# Patient Record
Sex: Female | Born: 1975 | Race: White | Hispanic: No | Marital: Single | Smoking: Current every day smoker
Health system: Southern US, Community
[De-identification: ages and names within clinical notes are randomized; demographics above are authoritative.]

## PROBLEM LIST (undated history)

## (undated) DIAGNOSIS — M549 Dorsalgia, unspecified: Secondary | ICD-10-CM

## (undated) DIAGNOSIS — IMO0002 Reserved for concepts with insufficient information to code with codable children: Secondary | ICD-10-CM

## (undated) DIAGNOSIS — G8929 Other chronic pain: Secondary | ICD-10-CM

## (undated) HISTORY — PX: ABDOMINAL SURGERY: SHX537

## (undated) HISTORY — PX: HERNIA REPAIR: SHX51

## (undated) HISTORY — PX: ABDOMINAL HYSTERECTOMY: SHX81

## (undated) HISTORY — PX: OOPHORECTOMY: SHX86

---

## 2001-05-27 ENCOUNTER — Ambulatory Visit (HOSPITAL_COMMUNITY): Admission: RE | Admit: 2001-05-27 | Discharge: 2001-05-27 | Payer: Self-pay | Admitting: Obstetrics and Gynecology

## 2001-05-27 ENCOUNTER — Encounter: Payer: Self-pay | Admitting: Obstetrics and Gynecology

## 2001-06-06 ENCOUNTER — Encounter: Payer: Self-pay | Admitting: Obstetrics and Gynecology

## 2001-06-06 ENCOUNTER — Ambulatory Visit (HOSPITAL_COMMUNITY): Admission: RE | Admit: 2001-06-06 | Discharge: 2001-06-06 | Payer: Self-pay | Admitting: Obstetrics and Gynecology

## 2001-11-25 ENCOUNTER — Observation Stay (HOSPITAL_COMMUNITY): Admission: RE | Admit: 2001-11-25 | Discharge: 2001-11-26 | Payer: Self-pay | Admitting: General Surgery

## 2004-04-22 ENCOUNTER — Ambulatory Visit: Payer: Self-pay | Admitting: *Deleted

## 2004-06-07 ENCOUNTER — Ambulatory Visit: Payer: Self-pay | Admitting: Family Medicine

## 2004-09-13 ENCOUNTER — Ambulatory Visit: Payer: Self-pay | Admitting: Family Medicine

## 2004-10-26 ENCOUNTER — Ambulatory Visit: Payer: Self-pay | Admitting: Family Medicine

## 2005-01-02 ENCOUNTER — Ambulatory Visit: Payer: Self-pay | Admitting: Family Medicine

## 2005-01-13 ENCOUNTER — Ambulatory Visit: Payer: Self-pay | Admitting: Family Medicine

## 2005-01-31 ENCOUNTER — Encounter: Admission: RE | Admit: 2005-01-31 | Discharge: 2005-03-29 | Payer: Self-pay | Admitting: Family Medicine

## 2005-02-28 ENCOUNTER — Ambulatory Visit: Payer: Self-pay | Admitting: Family Medicine

## 2005-11-30 ENCOUNTER — Ambulatory Visit (HOSPITAL_COMMUNITY): Admission: RE | Admit: 2005-11-30 | Discharge: 2005-11-30 | Payer: Self-pay | Admitting: Family Medicine

## 2006-11-10 ENCOUNTER — Emergency Department (HOSPITAL_COMMUNITY): Admission: EM | Admit: 2006-11-10 | Discharge: 2006-11-10 | Payer: Self-pay | Admitting: Emergency Medicine

## 2008-12-28 IMAGING — CR DG FOOT COMPLETE 3+V*L*
3 series · 3 of 3 positions shown · non-contrast
Comparison: none

CLINICAL DATA: Left foot trauma and laceration. Pain. 
 LEFT FOOT - 3 VIEW:

[view not recorded (1 of 3)]
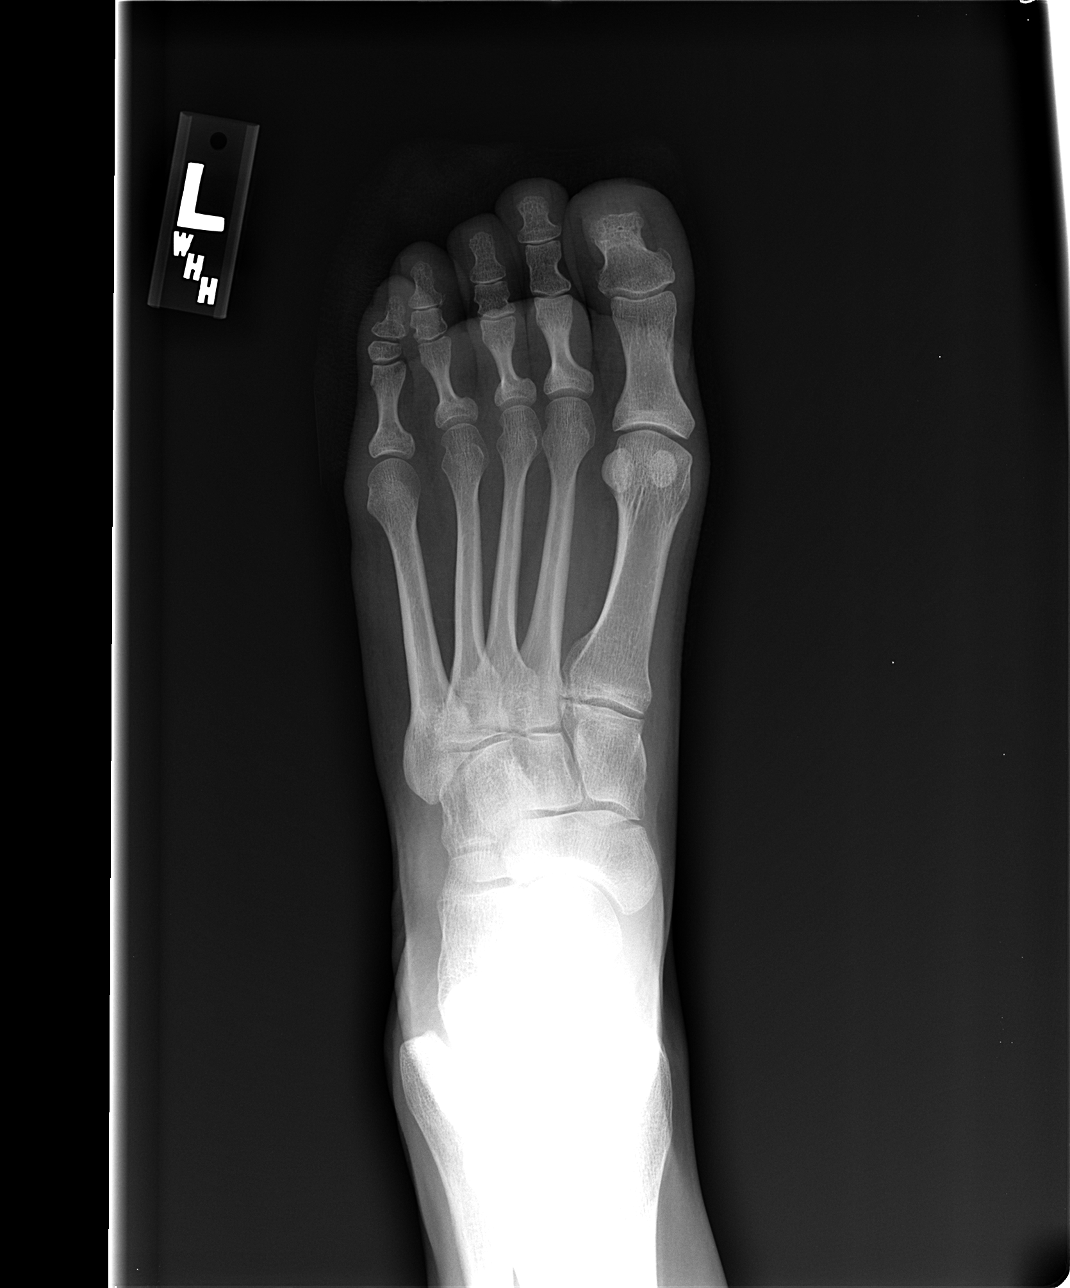

[view not recorded (2 of 3)]
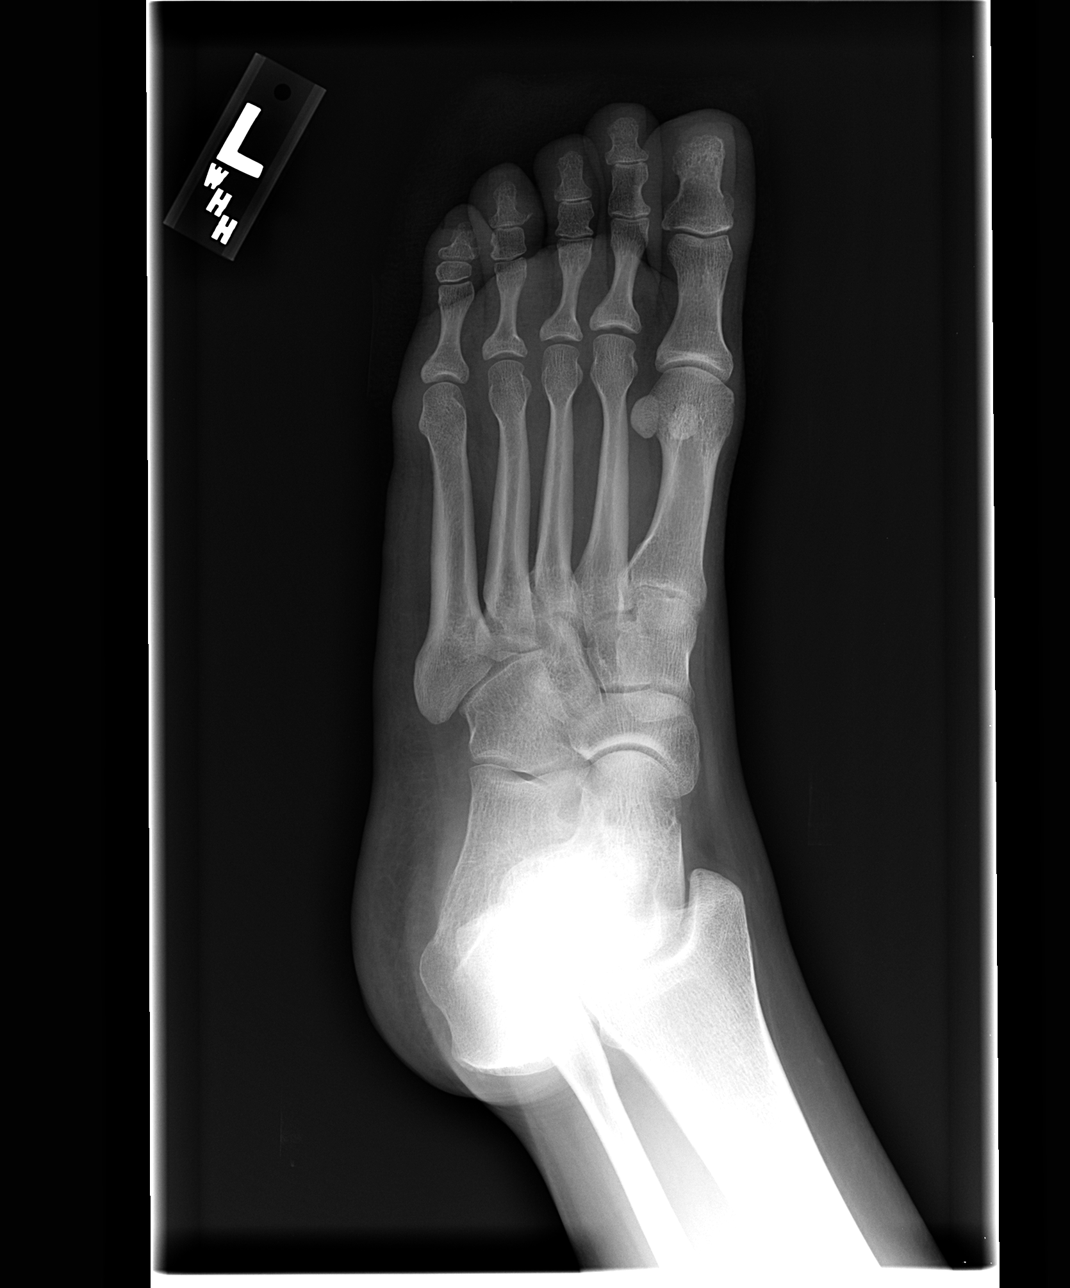

[view not recorded (3 of 3)]
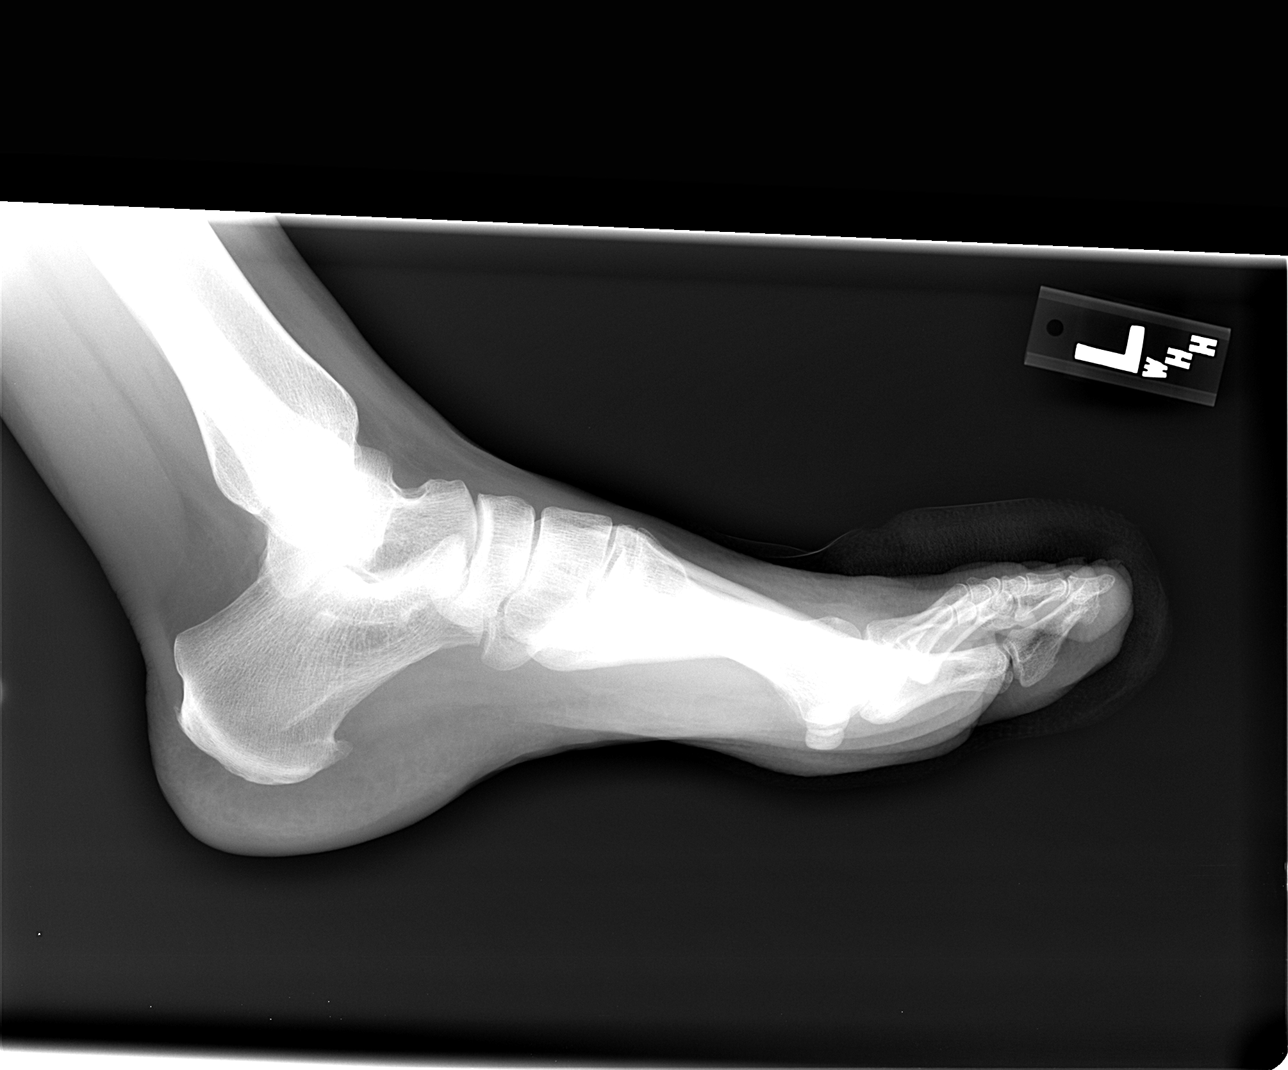

[3 of 3 positions shown; findings below may reference images not displayed]

FINDINGS: There is no evidence of fracture or dislocation. There is no evidence of bone destruction or periosteal reaction. There is a tiny linear radiodensity in the fourth toe along the medial aspect of the distal phalanx, suspicious for a small radiopaque foreign body. 
 Plantar calcaneal spur is also noted.
IMPRESSION: 1.  No evidence of fracture or dislocation.
 2.  Tiny radiodensity in the fourth toe along the medial aspect of the distal phalanx, suspicious for small radiopaque foreign body.

## 2009-08-02 ENCOUNTER — Emergency Department (HOSPITAL_COMMUNITY): Admission: EM | Admit: 2009-08-02 | Discharge: 2009-08-02 | Payer: Self-pay | Admitting: Emergency Medicine

## 2010-08-07 LAB — URINALYSIS, ROUTINE W REFLEX MICROSCOPIC
Hgb urine dipstick: NEGATIVE
Specific Gravity, Urine: >1.03 — ABNORMAL HIGH (ref 1.005–1.030)
Urobilinogen, UA: 0.2 mg/dL (ref 0.0–1.0)
pH: 6 (ref 5.0–8.0)

## 2010-08-07 LAB — PREGNANCY, URINE: Preg Test, Ur: NEGATIVE

## 2010-09-30 NOTE — Op Note (Signed)
[facility]  Patient:    Jasmine Becker, Jasmine Becker Visit Number: [id number] MRN: [id number]          Service Type: DSU Location: 3A A305 01 Attending Physician:  Dalia Heading Dictated by:   Franky Macho, M.D. Proc. Date: 11/25/01 Admit Date:  11/25/2001 Discharge Date: 11/26/2001   CC:         Dr. Despina Hidden   Operative Report  AGE:  35 years old  PREOPERATIVE DIAGNOSIS:  Incisional hernia.  POSTOPERATIVE DIAGNOSIS:  Incisional hernia.  PROCEDURE:  Incisional herniorrhaphy with mesh.  SURGEON:  Franky Macho, M.D.  ANESTHESIA:  General endotracheal.  INDICATIONS:  The patient is a 35 year old white female who presents with an incisional hernia in the suprapubic region.  She has had a previous lower midline incision, as well as Pfannenstiel incision.  The risks and the benefits of the procedure including bleeding, infection, and recurrence of the hernia were fully explained to the patient. I gained informed consent.  PROCEDURE NOTE:  The patient was placed in the supine position. After induction of general endotracheal anesthesia, the abdomen was prepped and draped using the usual sterile technique with Betadine.  A midline incision was made down to the suprapubic region.  The hernia defect was noted to be approximately 6-8 cm in size.  The hernia sac was noted to emanate through the rectus abdominis muscle.  This was freed away from the surrounding fascia and reduced.  The rectus muscle was then closed over the hernia defect.  Marlex mesh was then placed along this region, and the fascia of the anterior abdominal wall was then closed over this using 0 Surgitek interrupted sutures.  The superior portion of the abdominal wall was inspected, and several 0 Novofil sutures were placed to tighten the muscle where it appeared to be lax.  The wound was irrigated with normal saline. 0.5% Sensorcaine was instilled into the surrounding wound.  The  subcutaneous layer was reapproximated using a 2-0 Vicryl interrupted suture. The skin was closed using staples.  Betadine ointment and dry, sterile dressings were applied.  All tape and needle counts were correct at the end of the procedure.  The patient was extubated in the operating room and went back to the recovery room, awake and in stable condition.  COMPLICATIONS:  None.  SPECIMENS:  None.  BLOOD LOSS:  Minimal. Dictated by:   Franky Macho, M.D. Attending Physician:  Dalia Heading DD:  11/25/01 TD:  11/26/01 Job: [id number]

## 2011-01-10 ENCOUNTER — Emergency Department (HOSPITAL_COMMUNITY)
Admission: EM | Admit: 2011-01-10 | Discharge: 2011-01-10 | Disposition: A | Discharge status: Home / Self Care | Payer: Self-pay | Attending: Emergency Medicine | Admitting: Emergency Medicine

## 2011-01-10 DIAGNOSIS — R109 Unspecified abdominal pain: Secondary | ICD-10-CM | POA: Insufficient documentation

## 2011-01-10 DIAGNOSIS — A499 Bacterial infection, unspecified: Secondary | ICD-10-CM | POA: Insufficient documentation

## 2011-01-10 DIAGNOSIS — F17200 Nicotine dependence, unspecified, uncomplicated: Secondary | ICD-10-CM | POA: Insufficient documentation

## 2011-01-10 DIAGNOSIS — B9689 Other specified bacterial agents as the cause of diseases classified elsewhere: Secondary | ICD-10-CM | POA: Insufficient documentation

## 2011-01-10 DIAGNOSIS — N760 Acute vaginitis: Secondary | ICD-10-CM | POA: Insufficient documentation

## 2011-01-10 LAB — URINALYSIS, ROUTINE W REFLEX MICROSCOPIC
Glucose, UA: NEGATIVE mg/dL
Leukocytes, UA: NEGATIVE
Protein, ur: NEGATIVE mg/dL
Specific Gravity, Urine: 1.025 (ref 1.005–1.030)
Urobilinogen, UA: 0.2 mg/dL (ref 0.0–1.0)

## 2011-01-10 LAB — WET PREP, GENITAL: Yeast Wet Prep HPF POC: NONE SEEN

## 2011-01-10 MED ORDER — METRONIDAZOLE 500 MG PO TABS: ORAL_TABLET | ORAL | Status: DC

## 2011-01-10 MED ORDER — AZITHROMYCIN 250 MG PO TABS
1000.0000 mg | ORAL_TABLET | Freq: Once | ORAL | Status: AC
  Administered 2011-01-10: 1000 mg via ORAL
  Filled 2011-01-10: qty 4

## 2011-01-10 MED ORDER — HYDROCODONE-ACETAMINOPHEN 10-325 MG PO TABS: 1.0000 | ORAL_TABLET | ORAL | Status: AC | PRN

## 2011-01-10 MED ORDER — METRONIDAZOLE 500 MG PO TABS
500.0000 mg | ORAL_TABLET | Freq: Once | ORAL | Status: AC
  Administered 2011-01-10: 500 mg via ORAL
  Filled 2011-01-10: qty 1

## 2011-01-10 MED ORDER — ONDANSETRON HCL 4 MG PO TABS
4.0000 mg | ORAL_TABLET | Freq: Once | ORAL | Status: AC
  Administered 2011-01-10: 4 mg via ORAL
  Filled 2011-01-10: qty 1

## 2011-01-10 MED ORDER — CEFTRIAXONE SODIUM 250 MG IJ SOLR
250.0000 mg | Freq: Once | INTRAMUSCULAR | Status: AC
  Administered 2011-01-10: 250 mg via INTRAMUSCULAR
  Filled 2011-01-10: qty 250

## 2011-01-10 NOTE — ED Provider Notes (Signed)
Medical screening examination/treatment/procedure(s) were performed by non-physician practitioner and as supervising physician I was immediately available for consultation/collaboration.Devoria Albe, MD, FACEP  Ward Givens, MD 01/10/11 [phone]

## 2011-01-10 NOTE — ED Notes (Signed)
Pt states she has had a green vag discharge. States she has been taking her son's doxy

## 2011-01-10 NOTE — ED Provider Notes (Signed)
History     CSN: [id number] Arrival date & time: 01/10/2011  2:03 PM  Chief Complaint  Patient presents with  . Vaginal Discharge   Patient is a 35 y.o. female presenting with vaginal discharge. The history is provided by the patient.  Vaginal Discharge This is a new problem. The current episode started in the past 7 days. Associated symptoms include abdominal pain and a fever. Associated symptoms comments: dyspareunia.    History reviewed. No pertinent past medical history.  Past Surgical History  Procedure Date  . Abdominal surgery   . Abdominal hysterectomy   . Hernia repair     History reviewed. No pertinent family history.  History  Substance Use Topics  . Smoking status: Current Everyday Smoker  . Smokeless tobacco: Not on file  . Alcohol Use: Yes    OB History    Grav Para Term Preterm Abortions TAB SAB Ect Mult Living                  Review of Systems  Constitutional: Positive for fever.  Gastrointestinal: Positive for abdominal pain.  Genitourinary: Positive for vaginal discharge.    Physical Exam  BP 123/84  Pulse 84  Temp(Src) 98.5 F (36.9 C) (Oral)  Resp 20  Ht 5\' 3"  (1.6 m)  Wt 165 lb (74.844 kg)  BMI 29.23 kg/m2  SpO2 98%  Physical Exam  ED Course  Procedures  MDM I have reviewed nursing notes, vital signs, and all appropriate lab and imaging results for this patient.   Results for orders placed during the hospital encounter of 01/10/11  URINALYSIS, ROUTINE W REFLEX MICROSCOPIC      Component Value Range   Color, Urine YELLOW  YELLOW    Appearance CLEAR  CLEAR    Specific Gravity, Urine 1.025  1.005 - 1.030    pH 7.0  5.0 - 8.0    Glucose, UA NEGATIVE  NEGATIVE (mg/dL)   Hgb urine dipstick NEGATIVE  NEGATIVE    Bilirubin Urine NEGATIVE  NEGATIVE    Ketones, ur NEGATIVE  NEGATIVE (mg/dL)   Protein, ur NEGATIVE  NEGATIVE (mg/dL)   Urobilinogen, UA 0.2  0.0 - 1.0 (mg/dL)   Nitrite NEGATIVE  NEGATIVE    Leukocytes, UA NEGATIVE   NEGATIVE   WET PREP, GENITAL      Component Value Range   Yeast, Wet Prep NONE SEEN  NONE SEEN    Trich, Wet Prep NONE SEEN  NONE SEEN    Clue Cells, Wet Prep MANY (*) NONE SEEN    WBC, Wet Prep HPF POC MANY (*) NONE SEEN      [place], [place] 01/10/11 [phone]

## 2011-01-11 LAB — GC/CHLAMYDIA PROBE AMP, GENITAL
Chlamydia, DNA Probe: NEGATIVE
GC Probe Amp, Genital: NEGATIVE

## 2011-08-28 ENCOUNTER — Encounter (HOSPITAL_COMMUNITY): Payer: Self-pay | Admitting: Oncology

## 2011-08-28 ENCOUNTER — Emergency Department (HOSPITAL_COMMUNITY)
Admission: EM | Admit: 2011-08-28 | Discharge: 2011-08-28 | Disposition: A | Discharge status: Home / Self Care | Payer: Self-pay | Attending: Emergency Medicine | Admitting: Emergency Medicine

## 2011-08-28 DIAGNOSIS — F17200 Nicotine dependence, unspecified, uncomplicated: Secondary | ICD-10-CM | POA: Insufficient documentation

## 2011-08-28 DIAGNOSIS — R5381 Other malaise: Secondary | ICD-10-CM | POA: Insufficient documentation

## 2011-08-28 DIAGNOSIS — R233 Spontaneous ecchymoses: Secondary | ICD-10-CM | POA: Insufficient documentation

## 2011-08-28 DIAGNOSIS — IMO0002 Reserved for concepts with insufficient information to code with codable children: Secondary | ICD-10-CM | POA: Insufficient documentation

## 2011-08-28 HISTORY — DX: Reserved for concepts with insufficient information to code with codable children: IMO0002

## 2011-08-28 LAB — DIFFERENTIAL
Eosinophils Relative: 5 % (ref 0–5)
Lymphocytes Relative: 27 % (ref 12–46)
Lymphs Abs: 1.8 10*3/uL (ref 0.7–4.0)
Monocytes Absolute: 0.5 10*3/uL (ref 0.1–1.0)

## 2011-08-28 LAB — CBC
HCT: 37.1 % (ref 36.0–46.0)
MCV: 90.7 fL (ref 78.0–100.0)
RBC: 4.09 MIL/uL (ref 3.87–5.11)
WBC: 6.6 10*3/uL (ref 4.0–10.5)

## 2011-08-28 LAB — COMPREHENSIVE METABOLIC PANEL
BUN: 9 mg/dL (ref 6–23)
CO2: 29 mEq/L (ref 19–32)
Calcium: 10 mg/dL (ref 8.4–10.5)
Creatinine, Ser: 0.63 mg/dL (ref 0.50–1.10)
GFR calc Af Amer: >90 mL/min (ref >90)
GFR calc non Af Amer: >90 mL/min (ref >90)
Glucose, Bld: 110 mg/dL — ABNORMAL HIGH (ref 70–99)

## 2011-08-28 LAB — PROTIME-INR
INR: 1.03 (ref 0.00–1.49)
Prothrombin Time: 13.7 seconds (ref 11.6–15.2)

## 2011-08-28 NOTE — ED Notes (Signed)
Jasmine Becker reports red rash (no itching) to torso, groin, under arm, groin x 5 days - pt also reports easy bruising.  Jasmine Becker states she took benadryl and hydrocortisone w/o relief of symptoms.

## 2011-08-28 NOTE — ED Notes (Addendum)
Pt states "found rash on Wednesday (08/24/2011)" Pt refers to pain as pins and needles and generalized weakness/fatigue. Pt has noted petechiae in bilateral groin,bilateral axilla and on trunk of body. Pt denies N/V/D, fever and hematuria. Pt also denies coumadin, heparin therapy.

## 2011-08-28 NOTE — Discharge Instructions (Signed)
Your labs today are normal with no sign of any dangerous reason for this rash.  Get rechecked as discussed if your symptoms worsen in any way.

## 2011-08-28 NOTE — ED Provider Notes (Signed)
Medical screening examination/treatment/procedure(s) were conducted as a shared visit with non-physician practitioner(s) and myself.  I personally evaluated the patient during the encounter.  benign discrete petechiae anterior waist.  Patient is nontoxic.  Normal labs  Donnetta Hutching, MD 08/28/11 [phone]

## 2011-08-28 NOTE — ED Provider Notes (Signed)
History     CSN: [id number]  Arrival date & time 08/28/11  1001   First MD Initiated Contact with Patient 08/28/11 1153      Chief Complaint  Patient presents with  . Rash    (Consider location/radiation/quality/duration/timing/severity/associated sxs/prior treatment) Patient is a 36 y.o. female presenting with rash. The history is provided by the patient.  Rash  This is a new problem. Episode onset: 5 days ago. The problem has not changed since onset.The problem is associated with an unknown (She describes increased fatigue this weekend.) factor. There has been no fever. The rash is present on the trunk and groin (axilla). Pain scale: She reports sharp stabbing pinprick-like pain when the rash first started but has resolved. Pertinent negatives include no blisters, no itching and no weeping. She has tried antihistamines and steriods for the symptoms. The treatment provided no relief.    Past Medical History  Diagnosis Date  . DDD (degenerative disc disease)     Past Surgical History  Procedure Date  . Abdominal surgery   . Abdominal hysterectomy   . Hernia repair   . Oophorectomy     No family history on file.  History  Substance Use Topics  . Smoking status: Current Everyday Smoker  . Smokeless tobacco: Not on file  . Alcohol Use: Yes    OB History    Grav Para Term Preterm Abortions TAB SAB Ect Mult Living                  Review of Systems  Constitutional: Positive for fatigue. Negative for fever.  HENT: Negative for congestion, sore throat and neck pain.   Eyes: Negative.   Respiratory: Negative for chest tightness and shortness of breath.   Cardiovascular: Negative for chest pain.  Gastrointestinal: Negative for nausea and abdominal pain.  Genitourinary: Negative.   Musculoskeletal: Negative for myalgias, joint swelling and arthralgias.  Skin: Positive for rash. Negative for itching and wound.  Neurological: Negative for dizziness, weakness,  light-headedness, numbness and headaches.  Hematological: Negative.   Psychiatric/Behavioral: Negative.     Allergies  Review of patient's allergies indicates no known allergies.  Home Medications   Current Outpatient Rx  Name Route Sig Dispense Refill  . CALCIUM PO Oral Take 1 tablet by mouth daily.      Marland Kitchen VITAMIN D3 3000 UNITS PO TABS Oral Take 1 tablet by mouth daily.    . CYCLOBENZAPRINE HCL 10 MG PO TABS Oral Take 10 mg by mouth 3 (three) times daily.     Marland Kitchen DIPHENHYDRAMINE HCL 25 MG PO TABS Oral Take 25 mg by mouth every 6 (six) hours as needed. Allergic Reaction    . HYDROCODONE-ACETAMINOPHEN 10-500 MG PO TABS Oral Take 1 tablet by mouth 3 (three) times daily. For pain    . NAPROXEN SODIUM 220 MG PO TABS Oral Take 220 mg by mouth 2 (two) times daily as needed. Pain      BP 111/87  Pulse 107  Temp(Src) 97.9 F (36.6 C) (Oral)  Resp 18  Ht 5\' 4"  (1.626 m)  Wt 165 lb (74.844 kg)  BMI 28.32 kg/m2  SpO2 100%  Physical Exam  Nursing note and vitals reviewed. Constitutional: She is oriented to person, place, and time. She appears well-developed and well-nourished.  HENT:  Head: Normocephalic and atraumatic.  Eyes: Conjunctivae are normal.  Neck: Normal range of motion.  Cardiovascular: Normal rate, regular rhythm, normal heart sounds and intact distal pulses.   Pulmonary/Chest: Effort normal and  breath sounds normal. She has no wheezes.  Abdominal: Soft. Bowel sounds are normal. There is no tenderness.  Musculoskeletal: Normal range of motion.  Neurological: She is alert and oriented to person, place, and time.  Skin: Skin is warm and dry. Ecchymosis, petechiae and rash noted.       And nonblanching petechiae noted in bilateral lower abdomen across waistband location and also in bilateral groin.  She has a an old healing bruise on her left hand dorsum and right lower forearm.  Psychiatric: She has a normal mood and affect.    ED Course  Procedures (including critical  care time)  Labs Reviewed  COMPREHENSIVE METABOLIC PANEL - Abnormal; Notable for the following:    Glucose, Bld 110 (*)    Total Bilirubin 0.2 (*)    All other components within normal limits  CBC  DIFFERENTIAL  PROTIME-INR   No results found.   1. Petechial rash       MDM  Labs are normal today.  Patient discussed with Dr. Adriana Simas who also saw the patient.  Reassurance given there doesn't appear to be a dangerous reason for her rash.  Encouraged to keep a watch for any worsened symptoms, followup with her PCP or return here for any worsened rash, or new symptoms.        Candis Musa, PA 08/28/11 1408

## 2011-10-30 ENCOUNTER — Emergency Department (HOSPITAL_COMMUNITY)
Admission: EM | Admit: 2011-10-30 | Discharge: 2011-10-30 | Disposition: A | Discharge status: Home / Self Care | Payer: PRIVATE HEALTH INSURANCE | Attending: Emergency Medicine | Admitting: Emergency Medicine

## 2011-10-30 ENCOUNTER — Encounter (HOSPITAL_COMMUNITY): Payer: Self-pay | Admitting: *Deleted

## 2011-10-30 DIAGNOSIS — M51379 Other intervertebral disc degeneration, lumbosacral region without mention of lumbar back pain or lower extremity pain: Secondary | ICD-10-CM | POA: Insufficient documentation

## 2011-10-30 DIAGNOSIS — F17200 Nicotine dependence, unspecified, uncomplicated: Secondary | ICD-10-CM | POA: Insufficient documentation

## 2011-10-30 DIAGNOSIS — M5137 Other intervertebral disc degeneration, lumbosacral region: Secondary | ICD-10-CM | POA: Insufficient documentation

## 2011-10-30 DIAGNOSIS — G8929 Other chronic pain: Secondary | ICD-10-CM

## 2011-10-30 DIAGNOSIS — M545 Low back pain: Secondary | ICD-10-CM

## 2011-10-30 HISTORY — DX: Reserved for concepts with insufficient information to code with codable children: IMO0002

## 2011-10-30 HISTORY — DX: Other chronic pain: G89.29

## 2011-10-30 HISTORY — DX: Dorsalgia, unspecified: M54.9

## 2011-10-30 MED ORDER — HYDROCODONE-ACETAMINOPHEN 5-325 MG PO TABS: ORAL_TABLET | ORAL | Status: AC

## 2011-10-30 MED ORDER — NAPROXEN 250 MG PO TABS: 250.0000 mg | ORAL_TABLET | Freq: Two times a day (BID) | ORAL | Status: AC

## 2011-10-30 MED ORDER — METHOCARBAMOL 500 MG PO TABS: 1000.0000 mg | ORAL_TABLET | Freq: Four times a day (QID) | ORAL | Status: AC | PRN

## 2011-10-30 NOTE — Discharge Instructions (Signed)
RESOURCE GUIDE  Chronic Pain Problems: Contact [facility]  [phone] Patients need to be referred by their primary care doctor.  Insufficient Money for Medicine: Contact United Way:  call "211" or [facility] [phone].  No Primary Care Doctor: - Call [facility]  [phone] - can help you locate a primary care doctor that  accepts your insurance, provides certain services, etc. - Physician Referral Service- 1-800-533-3463  Agencies that provide inexpensive medical care: - [facility]  [phone] - [facility]  [phone] - [facility]  [phone] - [facility] Clinic  [phone] - Planned Parenthood  [phone] - [facility]  [phone]  Medicaid-accepting [place] Providers: - [facility]- [address]  [phone], Mon-Fri 9am-7pm, Sat 9am-1pm - [facility]- [address], [address]  [phone] - [facility]- [address], [address]  [phone] - [facility]- [address] [place] [address]  [phone] - Veita Bland- [address], [phone]  Only accepts [place] Access Medicaid patients after they have their name  applied to their card  Self Pay (no insurance) in [place]: - Sickle Cell Patients: Dr Eric Dean, [facility]  [address], [phone] - [facility]- [address]  [phone]       -     [facility]- [address]       -     [facility]- see information above (Speak to Pam H if you do not have insurance)       -  [facility]- [address], [phone]       -  [facility] [place]- [address],  [phone]       -  [facility]- [address] [place] [address], [phone]       -  Dr Osei-Bonsu-  [address], [place], [phone]       -  [facility]- [address], [phone]       -  [facility]- [address] [place] [address], [phone], also [address], [phone]       -    [facility]- [address], [phone], 1st & 3rd Saturday   every month, 10am-1pm  1) Find a Doctor and Pay Out of Pocket Although you won't have to find out who is covered by your insurance plan, it is a good idea to ask around and get recommendations. You will then need to call the office and see if the doctor you have chosen will accept you as a new patient and what types of options they offer for patients who are self-pay. Some doctors offer discounts or will set up payment plans for their patients who do not have insurance, but you will need to ask so you aren't surprised when you get to your appointment.  2) Contact Your Local Health Department Not all health departments have doctors that can see patients for sick visits, but many do, so it is worth a call to see if yours does. If you don't know where your local health department is, you can check in your phone book. The CDC also has a tool to help you locate your state's health department, and many state websites also have   listings of all of their local health departments.  3) Find a Walk-in Clinic If your illness is not likely to be very severe or complicated, you may want to try a walk in clinic. These are popping up all over the country in pharmacies, drugstores, and shopping centers. They're usually staffed by nurse practitioners or physician assistants that have been trained to treat common illnesses and complaints. They're usually fairly quick and inexpensive. However, if you have serious medical issues or chronic medical problems, these are probably not your best option  STD Testing - [place] [facility] [place], [facility] Clinic, [address], [place], phone [phone] or 1-877-539-9860.  Monday - Friday, call for an appointment. - [place]  [facility] [place], [facility]D Clinic, [address], [place], phone [phone] or 1-877-539-9860.  Monday - Friday, call for an appointment.  Abuse/Neglect: - [place] Child Abuse Hotline (336) 641-3795 - [place] Child Abuse Hotline 800-378-5315 (After Hours)  Emergency Shelter:  [facility] (336) 271-5985  Maternity Homes: - [facility] (336) 275-9566 - [facility] (704) 372-4663  MRSA Hotline #:   832-7006  [place] Resources  [facility] [place]  [employer] [place] [facility] [address]                 [address]         [address]  [place]                                               [place]                              [place] Phone:  [phone]                                  Phone:  [phone]                   Phone:  [phone]  [place] [facility], [phone] - [place] Services - [facility]- 1-888-581-9988       -     [facility] in [place], [address],                                  336-349-4454, Insurance  [place] Child Abuse Hotline (336) 342-1394 or (336) 342-3537 (After Hours)   Behavioral Health Services  Substance Abuse Resources: - [facility]  336-882-2125 - [facility] 336-784-9470 - [facility] 336-285-9073 - [facility] 336-845-3988 - Residential & Outpatient Substance Abuse Program  800-659-3381  Psychological Services: -  Health  [phone] - [facility]  [phone] - [place] [facility], [address], [place], ACCESS LINE: 1-800-853-5163 or 336-641-4981, Http://www.[facility].com/services/adult.htm  Dental Assistance  If unable to pay or uninsured, contact:  [facility] or [place] [facility] to become qualified for the adult dental  clinic.  Patients with Medicaid:  [facility] [address], [phone], [phone]  If unable   to pay, or uninsured, contact [facility] 6474887607) or [facility] Department 636-764-4352 in [place], [phone] in [facility]) to become qualified for the adult dental clinic  Other Low-Cost Community Dental Services: - [facility]- [address] [place], [place], [place], [address], [phone], Ext. [address], 2nd and 4th Thursday of the month at 6:30am.  10 clients each day by appointment, can sometimes see walk-in patients if someone does not show for an appointment. [facility]- [address] Ether Griffins [place], [place], [address], [phone] - [facility]- [address], [place], [place], [address], [phone] [facility]- [phone] [facility]- [phone] [facility] Department586 [phone]     Take the prescriptions as directed.  Apply moist heat or ice to the area(s) of discomfort, for 15 minutes at a time, several times per day for the next few days.  Do not fall asleep on a heating or ice pack.  Call your regular medical doctor and the Neurosurgeon today to schedule a follow up appointment within the next week.  Return to the Emergency Department immediately if worsening.

## 2011-10-30 NOTE — ED Provider Notes (Signed)
History     CSN: [id number]  Arrival date & time 10/30/11  1300   First MD Initiated Contact with Patient 10/30/11 1321      Chief Complaint  Patient presents with  . Back Pain    HPI Pt was seen at 1325.  Per pt, c/o gradual onset and persistence of constant acute flair of her chronic low back "pain" for the past several days.  Denies any change in her usual chronic pain pattern.  Pain worsens with palpation of the area and body position changes. Denies incont/retention of bowel or bladder, no saddle anesthesia, no focal motor weakness, no tingling/numbness in extremities, no fevers, no injury.   The symptoms have been associated with no other complaints. The patient has a significant history of similar symptoms previously.    Past Medical History  Diagnosis Date  . DDD (degenerative disc disease)   . Degenerative disc disease   . Chronic back pain     Past Surgical History  Procedure Date  . Abdominal surgery   . Abdominal hysterectomy   . Hernia repair   . Oophorectomy     History  Substance Use Topics  . Smoking status: Current Everyday Smoker  . Smokeless tobacco: Not on file  . Alcohol Use: Yes    Review of Systems ROS: Statement: All systems negative except as marked or noted in the HPI; Constitutional: Negative for fever and chills. ; ; Eyes: Negative for eye pain, redness and discharge. ; ; ENMT: Negative for ear pain, hoarseness, nasal congestion, sinus pressure and sore throat. ; ; Cardiovascular: Negative for chest pain, palpitations, diaphoresis, dyspnea and peripheral edema. ; ; Respiratory: Negative for cough, wheezing and stridor. ; ; Gastrointestinal: Negative for nausea, vomiting, diarrhea, abdominal pain, blood in stool, hematemesis, jaundice and rectal bleeding. . ; ; Genitourinary: Negative for dysuria, flank pain and hematuria. ; ; Musculoskeletal: +LBP.  Negative for neck pain. Negative for swelling and trauma.; ; Skin: Negative for pruritus, rash,  abrasions, blisters, bruising and skin lesion.; ; Neuro: Negative for headache, lightheadedness and neck stiffness. Negative for weakness, altered level of consciousness , altered mental status, extremity weakness, paresthesias, involuntary movement, seizure and syncope.       Allergies  Review of patient's allergies indicates no known allergies.  Home Medications   Current Outpatient Rx  Name Route Sig Dispense Refill  . CALCIUM PO Oral Take 1 tablet by mouth daily.      Marland Kitchen VITAMIN D3 3000 UNITS PO TABS Oral Take 1 tablet by mouth daily.    . CYCLOBENZAPRINE HCL 10 MG PO TABS Oral Take 10 mg by mouth 3 (three) times daily.     Marland Kitchen DIPHENHYDRAMINE HCL 25 MG PO TABS Oral Take 25 mg by mouth every 6 (six) hours as needed. Allergic Reaction    . HYDROCODONE-ACETAMINOPHEN 10-500 MG PO TABS Oral Take 1 tablet by mouth 3 (three) times daily. For pain    . NAPROXEN SODIUM 220 MG PO TABS Oral Take 220 mg by mouth 2 (two) times daily as needed. Pain      BP 108/67  Pulse 98  Temp 97.9 F (36.6 C) (Oral)  Resp 20  Ht 5\' 4"  (1.626 m)  Wt 164 lb (74.39 kg)  BMI 28.15 kg/m2  SpO2 100%  Physical Exam 1330: Physical examination:  Nursing notes reviewed; Vital signs and O2 SAT reviewed;  Constitutional: Well developed, Well nourished, Well hydrated, In no acute distress; Head:  Normocephalic, atraumatic; Eyes: EOMI, PERRL, No scleral  icterus; ENMT: Mouth and pharynx normal, Mucous membranes moist; Neck: Supple, Full range of motion, No lymphadenopathy; Cardiovascular: Regular rate and rhythm, No murmur, rub, or gallop; Respiratory: Breath sounds clear & equal bilaterally, No rales, rhonchi, wheezes.  Speaking full sentences with ease, Normal respiratory effort/excursion; Chest: Nontender, Movement normal; Abdomen: Soft, Nontender, Nondistended, Normal bowel sounds; Genitourinary: No CVA tenderness; Spine:  No midline CS, TS, LS tenderness.  +TTP left lumbar paraspinal muscles, no rash;  Extremities:  Pulses normal, No tenderness, No edema, No calf edema or asymmetry.; Neuro: AA&Ox3, Major CN grossly intact.  Speech clear. Strength 5/5 equal bilat UE's and LE's, including great toe dorsiflexion.  DTR 2/4 equal bilat UE's and LE's.  No gross sensory deficits.  Neg straight leg raises bilat. Gait steady.; Skin: Color normal, Warm, Dry.   ED Course  Procedures   MDM  MDM Reviewed: nursing note and vitals       1:46 PM:  Long hx of chronic back pain.  Pt endorses acute flair of her usual long standing chronic pain today, no change from her usual chronic pain pattern.  States her PMD is "cutting down" her long term narcotic pain meds rx.   Pt encouraged to f/u with her PMD and Pain Management doctor for good continuity of care and control of her chronic pain.  Verb understanding.    2:37 PM:  T/C from pt's pharmacy:  Apparently pt just filled rx lortab 10/500mg , (#45) with 5 refills, rx on 10/23/11.  Pharmacist told to NOT fill new rx from today.     Laray Anger, DO 10/30/11 Mallie Snooks

## 2011-10-30 NOTE — ED Notes (Signed)
Pain low back , and down lt leg.

## 2013-03-24 ENCOUNTER — Emergency Department (HOSPITAL_COMMUNITY): Payer: Self-pay

## 2013-03-24 ENCOUNTER — Encounter (HOSPITAL_COMMUNITY): Payer: Self-pay | Admitting: Emergency Medicine

## 2013-03-24 ENCOUNTER — Emergency Department (HOSPITAL_COMMUNITY)
Admission: EM | Admit: 2013-03-24 | Discharge: 2013-03-24 | Disposition: A | Discharge status: Home / Self Care | Payer: Self-pay | Attending: Emergency Medicine | Admitting: Emergency Medicine

## 2013-03-24 DIAGNOSIS — Z9889 Other specified postprocedural states: Secondary | ICD-10-CM | POA: Insufficient documentation

## 2013-03-24 DIAGNOSIS — Z79899 Other long term (current) drug therapy: Secondary | ICD-10-CM | POA: Insufficient documentation

## 2013-03-24 DIAGNOSIS — H81399 Other peripheral vertigo, unspecified ear: Secondary | ICD-10-CM | POA: Insufficient documentation

## 2013-03-24 DIAGNOSIS — M545 Low back pain, unspecified: Secondary | ICD-10-CM | POA: Insufficient documentation

## 2013-03-24 DIAGNOSIS — Z9079 Acquired absence of other genital organ(s): Secondary | ICD-10-CM | POA: Insufficient documentation

## 2013-03-24 DIAGNOSIS — Z90710 Acquired absence of both cervix and uterus: Secondary | ICD-10-CM | POA: Insufficient documentation

## 2013-03-24 DIAGNOSIS — J3489 Other specified disorders of nose and nasal sinuses: Secondary | ICD-10-CM | POA: Insufficient documentation

## 2013-03-24 DIAGNOSIS — R109 Unspecified abdominal pain: Secondary | ICD-10-CM

## 2013-03-24 DIAGNOSIS — R1012 Left upper quadrant pain: Secondary | ICD-10-CM | POA: Insufficient documentation

## 2013-03-24 DIAGNOSIS — G8929 Other chronic pain: Secondary | ICD-10-CM | POA: Insufficient documentation

## 2013-03-24 DIAGNOSIS — F17200 Nicotine dependence, unspecified, uncomplicated: Secondary | ICD-10-CM | POA: Insufficient documentation

## 2013-03-24 LAB — CBC WITH DIFFERENTIAL/PLATELET
Basophils Relative: 1 % (ref 0–1)
Eosinophils Absolute: 0 10*3/uL (ref 0.0–0.7)
Lymphs Abs: 2.2 10*3/uL (ref 0.7–4.0)
MCH: 31.6 pg (ref 26.0–34.0)
Neutrophils Relative %: 69 % (ref 43–77)
Platelets: 357 10*3/uL (ref 150–400)
RBC: 4.69 MIL/uL (ref 3.87–5.11)

## 2013-03-24 LAB — URINALYSIS W MICROSCOPIC + REFLEX CULTURE
Leukocytes, UA: NEGATIVE
Nitrite: NEGATIVE
Specific Gravity, Urine: <1.005 — ABNORMAL LOW (ref 1.005–1.030)
pH: 6.5 (ref 5.0–8.0)

## 2013-03-24 LAB — BASIC METABOLIC PANEL
Calcium: 10.1 mg/dL (ref 8.4–10.5)
GFR calc non Af Amer: >90 mL/min (ref >90)
Glucose, Bld: 153 mg/dL — ABNORMAL HIGH (ref 70–99)
Sodium: 139 mEq/L (ref 135–145)

## 2013-03-24 LAB — HEPATIC FUNCTION PANEL
Albumin: 4.2 g/dL (ref 3.5–5.2)
Alkaline Phosphatase: 67 U/L (ref 39–117)
Total Protein: 8 g/dL (ref 6.0–8.3)

## 2013-03-24 LAB — LIPASE, BLOOD: Lipase: 35 U/L (ref 11–59)

## 2013-03-24 MED ORDER — ONDANSETRON 8 MG PO TBDP
8.0000 mg | ORAL_TABLET | Freq: Once | ORAL | Status: AC
  Administered 2013-03-24: 8 mg via ORAL
  Filled 2013-03-24: qty 1

## 2013-03-24 MED ORDER — DIAZEPAM 5 MG PO TABS
5.0000 mg | ORAL_TABLET | Freq: Once | ORAL | Status: AC
  Administered 2013-03-24: 5 mg via ORAL
  Filled 2013-03-24: qty 1

## 2013-03-24 MED ORDER — MECLIZINE HCL 25 MG PO TABS: 25.0000 mg | ORAL_TABLET | Freq: Four times a day (QID) | ORAL | Status: AC | PRN

## 2013-03-24 MED ORDER — MECLIZINE HCL 12.5 MG PO TABS
25.0000 mg | ORAL_TABLET | Freq: Once | ORAL | Status: AC
  Administered 2013-03-24: 25 mg via ORAL
  Filled 2013-03-24: qty 2

## 2013-03-24 MED ORDER — ONDANSETRON HCL 4 MG PO TABS: 4.0000 mg | ORAL_TABLET | Freq: Three times a day (TID) | ORAL | Status: AC | PRN

## 2013-03-24 MED ORDER — POTASSIUM CHLORIDE 20 MEQ/15ML (10%) PO LIQD
40.0000 meq | Freq: Once | ORAL | Status: AC
  Administered 2013-03-24: 40 meq via ORAL
  Filled 2013-03-24: qty 30

## 2013-03-24 NOTE — ED Provider Notes (Signed)
CSN: [id number]     Arrival date & time 03/24/13  1119 History   First MD Initiated Contact with Patient 03/24/13 1546     Chief Complaint  Patient presents with  . Abdominal Pain  . Dizziness    HPI Pt was seen at 1615. Per pt, c/o gradual onset and persistence of multiple intermittent episodes of "dizziness" for the past 1 week. Has been associated with several episodes of N/V and vague left upper abd "pain." Pt describes the dizziness as "the room is spinning around," which worsens when she turns her head side to side or changes body positions. Denies diarrhea, no CP/palpitations, no SOB/cough, no back pain, no fevers, no neck pain, no headache, no injury.    Past Medical History  Diagnosis Date  . DDD (degenerative disc disease)   . Degenerative disc disease   . Chronic back pain    Past Surgical History  Procedure Laterality Date  . Abdominal hysterectomy    . Hernia repair    . Oophorectomy    . Abdominal surgery      History  Substance Use Topics  . Smoking status: Current Every Day Smoker  . Smokeless tobacco: Not on file  . Alcohol Use: No    Review of Systems ROS: Statement: All systems negative except as marked or noted in the HPI; Constitutional: Negative for fever and chills. ; ; Eyes: Negative for eye pain, redness and discharge. ; ; ENMT: Negative for ear pain, hoarseness, sore throat. +nasal congestion, sinus pressure. ; ; Cardiovascular: Negative for chest pain, palpitations, diaphoresis, dyspnea and peripheral edema. ; ; Respiratory: Negative for cough, wheezing and stridor. ; ; Gastrointestinal: +N/V, abd pain. Negative for diarrhea, blood in stool, hematemesis, jaundice and rectal bleeding. . ; ; Genitourinary: Negative for dysuria, flank pain and hematuria. ; ; Musculoskeletal: Negative for back pain and neck pain. Negative for swelling and trauma.; ; Skin: Negative for pruritus, rash, abrasions, blisters, bruising and skin lesion.; ; Neuro: +"dizziness."  Negative for headache, lightheadedness and neck stiffness. Negative for weakness, altered level of consciousness , altered mental status, extremity weakness, paresthesias, involuntary movement, seizure and syncope.     Allergies  Review of patient's allergies indicates no known allergies.  Home Medications   Current Outpatient Rx  Name  Route  Sig  Dispense  Refill  . bisacodyl (DULCOLAX) 5 MG EC tablet   Oral   Take 5 mg by mouth daily as needed for mild constipation or moderate constipation.         Marland Kitchen CALCIUM PO   Oral   Take 1 tablet by mouth daily.           Marland Kitchen ibuprofen (ADVIL,MOTRIN) 200 MG tablet   Oral   Take 600-800 mg by mouth every 6 (six) hours as needed for fever, headache, mild pain, moderate pain or cramping.          BP 140/90  Pulse 78  Temp(Src) 98.1 F (36.7 C) (Oral)  Resp 20  Ht 5\' 3"  (1.6 m)  Wt 150 lb (68.04 kg)  BMI 26.58 kg/m2  SpO2 98% Physical Exam 1620: Physical examination:  Nursing notes reviewed; Vital signs and O2 SAT reviewed;  Constitutional: Well developed, Well nourished, Well hydrated, In no acute distress; Head:  Normocephalic, atraumatic; Eyes: EOMI, PERRL, No scleral icterus; ENMT: TM's clear bilar. +edemetous nasal turbinates bilat with clear rhinorrhea. Mouth and pharynx normal, Mucous membranes moist; Neck: Supple, Full range of motion, No lymphadenopathy. No meningeal signs.; Cardiovascular:  Regular rate and rhythm, No murmur, rub, or gallop; Respiratory: Breath sounds clear & equal bilaterally, No rales, rhonchi, wheezes.  Speaking full sentences with ease, Normal respiratory effort/excursion; Chest: Nontender, Movement normal; Abdomen: Soft, Nontender, Nondistended, Normal bowel sounds; Genitourinary: No CVA tenderness; Spine:  No midline CS, TS, LS tenderness. +TTP bilat lumbar paraspinal muscles;; Extremities: Pulses normal, No tenderness, No edema, No calf edema or asymmetry.; Neuro: AA&Ox3, Major CN grossly intact. No facial  droop. +right horizontal end gaze fatigable nystagmus that reproduces pt's symptoms. Speech clear. Climbs on and off stretcher easily by herself. Gait steady. No gross focal motor or sensory deficits in extremities.; Skin: Color normal, Warm, Dry.   ED Course  Procedures    EKG Interpretation   None       MDM  MDM Reviewed: previous chart, nursing note and vitals Reviewed previous: labs Interpretation: labs and x-ray     Results for orders placed during the hospital encounter of 03/24/13  BASIC METABOLIC PANEL      Result Value Range   Sodium 139  135 - 145 mEq/L   Potassium 3.3 (*) 3.5 - 5.1 mEq/L   Chloride 101  96 - 112 mEq/L   CO2 27  19 - 32 mEq/L   Glucose, Bld 153 (*) 70 - 99 mg/dL   BUN 5 (*) 6 - 23 mg/dL   Creatinine, Ser 1.61  0.50 - 1.10 mg/dL   Calcium 09.6  8.4 - 04.5 mg/dL   GFR calc non Af Amer >90  >90 mL/min   GFR calc Af Amer >90  >90 mL/min  CBC WITH DIFFERENTIAL      Result Value Range   WBC 8.7  4.0 - 10.5 K/uL   RBC 4.69  3.87 - 5.11 MIL/uL   Hemoglobin 14.8  12.0 - 15.0 g/dL   HCT 40.9  81.1 - 91.4 %   MCV 91.3  78.0 - 100.0 fL   MCH 31.6  26.0 - 34.0 pg   MCHC 34.6  30.0 - 36.0 g/dL   RDW 78.2  95.6 - 21.3 %   Platelets 357  150 - 400 K/uL   Neutrophils Relative % 69  43 - 77 %   Neutro Abs 6.0  1.7 - 7.7 K/uL   Lymphocytes Relative 25  12 - 46 %   Lymphs Abs 2.2  0.7 - 4.0 K/uL   Monocytes Relative 5  3 - 12 %   Monocytes Absolute 0.5  0.1 - 1.0 K/uL   Eosinophils Relative 0  0 - 5 %   Eosinophils Absolute 0.0  0.0 - 0.7 K/uL   Basophils Relative 1  0 - 1 %   Basophils Absolute 0.1  0.0 - 0.1 K/uL  HEPATIC FUNCTION PANEL      Result Value Range   Total Protein 8.0  6.0 - 8.3 g/dL   Albumin 4.2  3.5 - 5.2 g/dL   AST 12  0 - 37 U/L   ALT 12  0 - 35 U/L   Alkaline Phosphatase 67  39 - 117 U/L   Total Bilirubin 0.4  0.3 - 1.2 mg/dL   Bilirubin, Direct <0.8  0.0 - 0.3 mg/dL   Indirect Bilirubin NOT CALCULATED  0.3 - 0.9 mg/dL   LIPASE, BLOOD      Result Value Range   Lipase 35  11 - 59 U/L  URINALYSIS W MICROSCOPIC + REFLEX CULTURE      Result Value Range   Color, Urine YELLOW  YELLOW  APPearance CLEAR  CLEAR   Specific Gravity, Urine <1.005 (*) 1.005 - 1.030   pH 6.5  5.0 - 8.0   Glucose, UA NEGATIVE  NEGATIVE mg/dL   Hgb urine dipstick NEGATIVE  NEGATIVE   Bilirubin Urine NEGATIVE  NEGATIVE   Ketones, ur NEGATIVE  NEGATIVE mg/dL   Protein, ur NEGATIVE  NEGATIVE mg/dL   Urobilinogen, UA 0.2  0.0 - 1.0 mg/dL   Nitrite NEGATIVE  NEGATIVE   Leukocytes, UA NEGATIVE  NEGATIVE   Squamous Epithelial / LPF RARE  RARE   Dg Abd Acute W/chest 03/24/2013   CLINICAL DATA:  Abdominal pain. Nausea. Dizziness. weakness. Recent weight loss.  EXAM: ACUTE ABDOMEN SERIES (ABDOMEN 2 VIEW & CHEST 1 VIEW)  COMPARISON:  None.  FINDINGS: There is no evidence of dilated bowel loops or free intraperitoneal air. No radiopaque calculi or other significant radiographic abnormality is seen.  Heart size and mediastinal contours are within normal limits. Both lungs are clear.  IMPRESSION: Negative abdominal radiographs.  No acute cardiopulmonary disease.   Electronically Signed   By: Myles Rosenthal M.D.   On: 03/24/2013 17:10    1900:  Pt states she feels "better now" and wants to go home. Potassium repleted PO. Pt has tol PO well without N/V. Pt has ambulated around the ED with steady gait, NAD. Abd remains benign, VSS. Will continue to tx symptomatically. Dx and testing d/w pt and family.  Questions answered.  Verb understanding, agreeable to d/c home with outpt f/u.   Laray Anger, DO 03/27/13 1645

## 2013-03-24 NOTE — ED Notes (Signed)
abd pain for 1 week,dizzy , nausea, vomiting.  Upper back pain.  No diarrhea.  Decreased appetite.

## 2013-03-24 NOTE — ED Notes (Signed)
Nausea/vomiting with any food.  Vague description of LLQ pain and Lflank pain.

## 2013-03-24 NOTE — ED Notes (Signed)
Patient states she is feeling much better, dizziness subsided.  Dr. Richrd Prime made aware.

## 2015-01-27 ENCOUNTER — Other Ambulatory Visit: Payer: Self-pay | Admitting: General Surgery

## 2015-01-27 ENCOUNTER — Other Ambulatory Visit: Payer: Self-pay

## 2015-01-27 DIAGNOSIS — N6452 Nipple discharge: Secondary | ICD-10-CM

## 2015-01-27 NOTE — Addendum Note (Signed)
Addended by: Adolph Pollack on: 01/27/2015 11:46 AM   Modules accepted: Orders

## 2015-01-28 ENCOUNTER — Other Ambulatory Visit: Payer: Self-pay

## 2015-02-02 ENCOUNTER — Ambulatory Visit
Admission: RE | Admit: 2015-02-02 | Discharge: 2015-02-02 | Disposition: A | Discharge status: Home / Self Care | Payer: Medicaid Other | Source: Ambulatory Visit | Attending: General Surgery | Admitting: General Surgery

## 2015-02-02 ENCOUNTER — Other Ambulatory Visit: Payer: Self-pay | Admitting: General Surgery

## 2015-02-02 DIAGNOSIS — N6452 Nipple discharge: Secondary | ICD-10-CM

## 2015-02-16 ENCOUNTER — Inpatient Hospital Stay: Admission: RE | Admit: 2015-02-16 | Payer: Medicaid Other | Source: Ambulatory Visit

## 2015-02-23 ENCOUNTER — Ambulatory Visit
Admission: RE | Admit: 2015-02-23 | Discharge: 2015-02-23 | Disposition: A | Discharge status: Home / Self Care | Payer: Medicaid Other | Source: Ambulatory Visit | Attending: General Surgery | Admitting: General Surgery

## 2015-02-23 DIAGNOSIS — N6452 Nipple discharge: Secondary | ICD-10-CM

## 2015-05-12 IMAGING — CR DG ABDOMEN ACUTE W/ 1V CHEST
3 series · 3 of 3 positions shown · non-contrast
Comparison: None.

CLINICAL DATA: Abdominal pain. Nausea. Dizziness. weakness. Recent
weight loss.

EXAM:
ACUTE ABDOMEN SERIES (ABDOMEN 2 VIEW & CHEST 1 VIEW)

[view not recorded (1 of 3)]
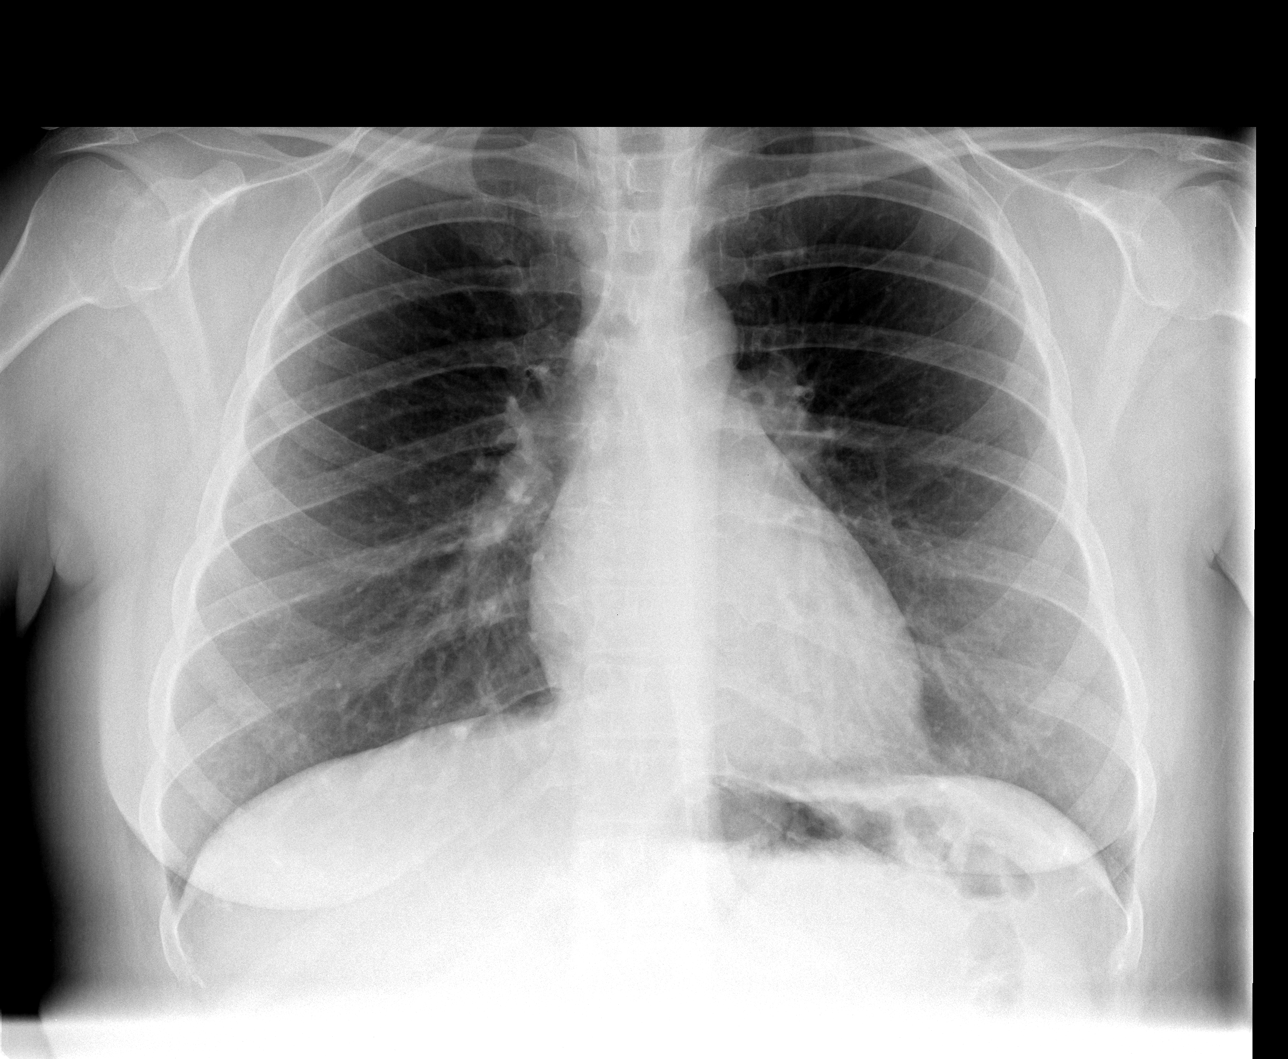

[view not recorded (2 of 3)]
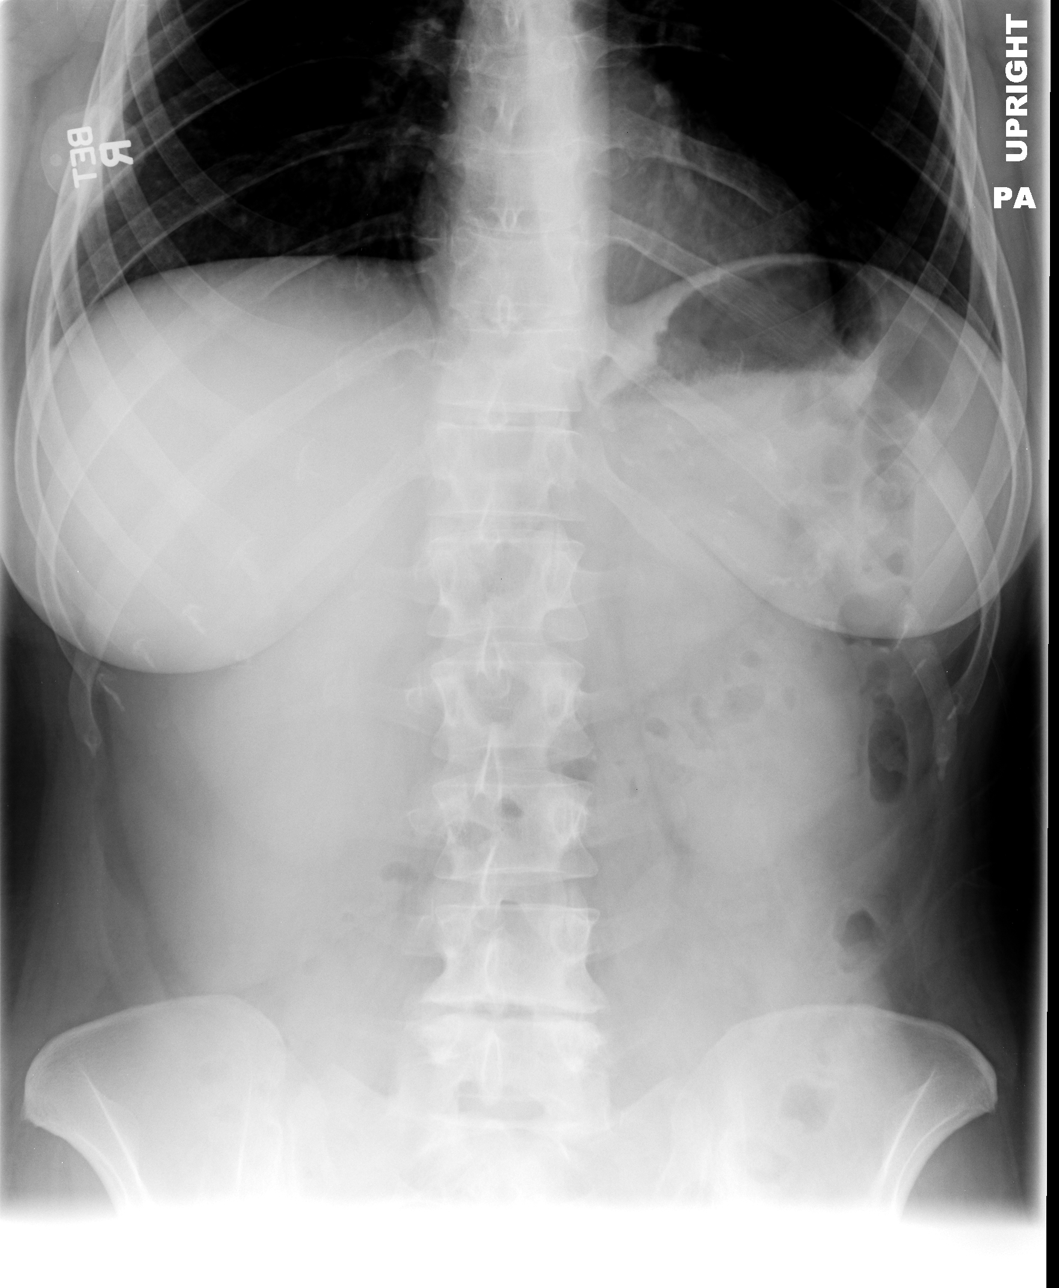

[view not recorded (3 of 3)]
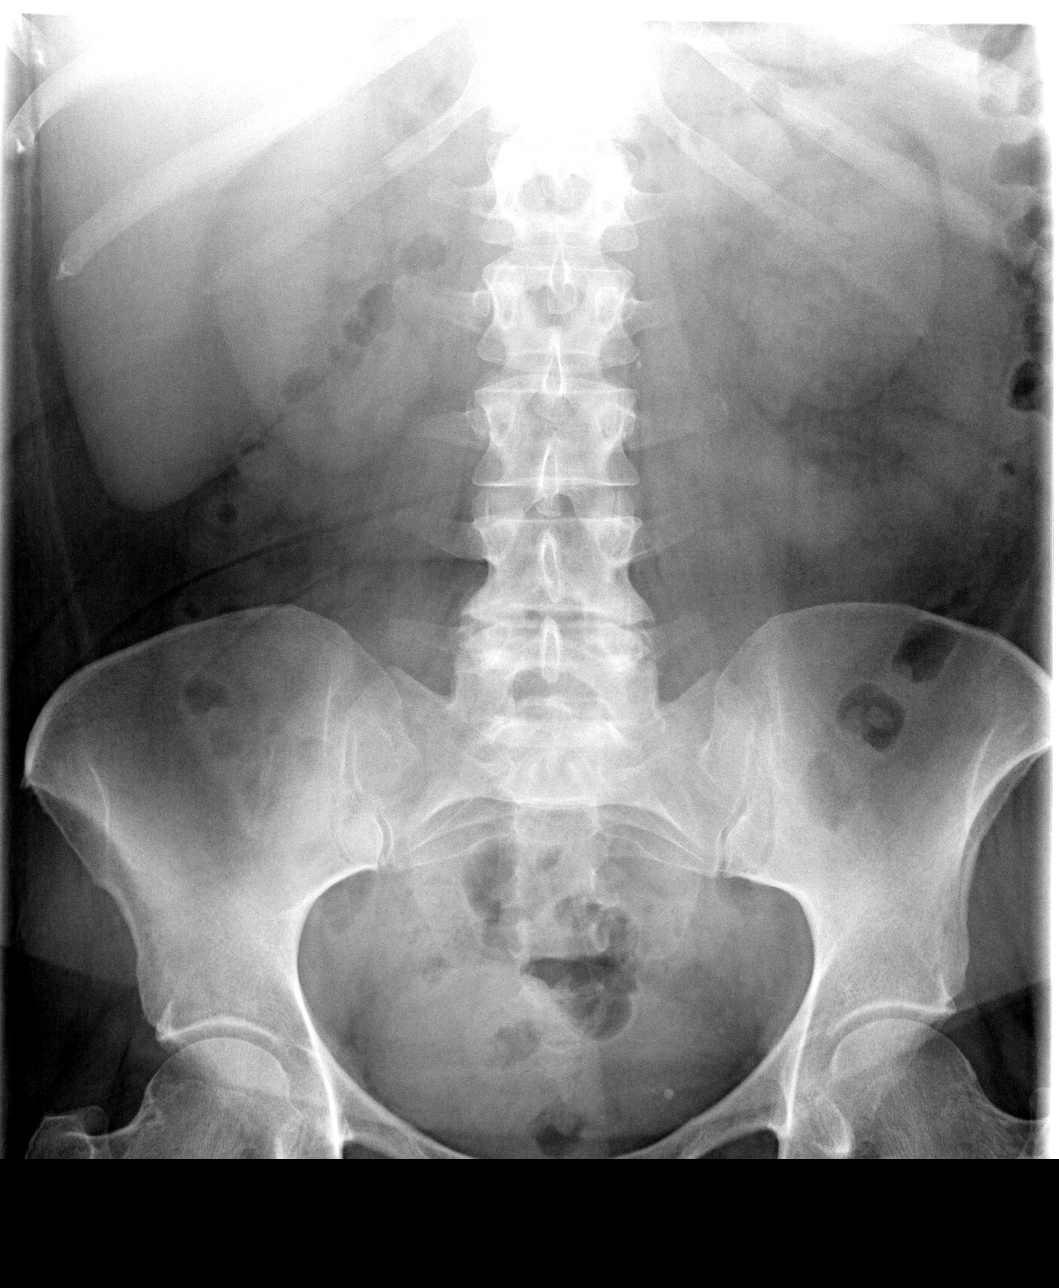

[3 of 3 positions shown; findings below may reference images not displayed]

FINDINGS: There is no evidence of dilated bowel loops or free intraperitoneal
air. No radiopaque calculi or other significant radiographic
abnormality is seen.

Heart size and mediastinal contours are within normal limits. Both
lungs are clear.
IMPRESSION: Negative abdominal radiographs.  No acute cardiopulmonary disease.

## 2016-10-19 ENCOUNTER — Other Ambulatory Visit (HOSPITAL_COMMUNITY): Payer: Self-pay | Admitting: *Deleted

## 2016-10-19 DIAGNOSIS — N6452 Nipple discharge: Secondary | ICD-10-CM

## 2016-10-24 ENCOUNTER — Ambulatory Visit (HOSPITAL_COMMUNITY)
Admission: RE | Admit: 2016-10-24 | Discharge: 2016-10-24 | Disposition: A | Discharge status: Home / Self Care | Payer: PRIVATE HEALTH INSURANCE | Source: Ambulatory Visit | Attending: *Deleted | Admitting: *Deleted

## 2016-10-24 DIAGNOSIS — N6452 Nipple discharge: Secondary | ICD-10-CM | POA: Diagnosis not present

## 2016-11-07 ENCOUNTER — Ambulatory Visit: Payer: PRIVATE HEALTH INSURANCE | Admitting: General Surgery

## 2016-12-14 ENCOUNTER — Ambulatory Visit: Payer: PRIVATE HEALTH INSURANCE | Admitting: General Surgery

## 2017-01-11 ENCOUNTER — Ambulatory Visit: Payer: PRIVATE HEALTH INSURANCE | Admitting: General Surgery

## 2017-08-13 DEATH — deceased
